# Patient Record
Sex: Male | Born: 1985 | Race: White | Hispanic: No | Marital: Married | Smoking: Never smoker
Health system: Southern US, Community
[De-identification: ages and names within clinical notes are randomized; demographics above are authoritative.]

---

## 2018-01-12 ENCOUNTER — Other Ambulatory Visit: Payer: Self-pay

## 2018-01-12 DIAGNOSIS — Y939 Activity, unspecified: Secondary | ICD-10-CM | POA: Insufficient documentation

## 2018-01-12 DIAGNOSIS — Y92410 Unspecified street and highway as the place of occurrence of the external cause: Secondary | ICD-10-CM | POA: Insufficient documentation

## 2018-01-12 DIAGNOSIS — S39012A Strain of muscle, fascia and tendon of lower back, initial encounter: Secondary | ICD-10-CM | POA: Diagnosis not present

## 2018-01-12 DIAGNOSIS — Y999 Unspecified external cause status: Secondary | ICD-10-CM | POA: Diagnosis not present

## 2018-01-12 DIAGNOSIS — S3992XA Unspecified injury of lower back, initial encounter: Secondary | ICD-10-CM | POA: Diagnosis present

## 2018-01-12 DIAGNOSIS — S060X0A Concussion without loss of consciousness, initial encounter: Secondary | ICD-10-CM | POA: Insufficient documentation
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
## 2018-01-13 ENCOUNTER — Emergency Department: Payer: No Typology Code available for payment source

## 2018-01-13 ENCOUNTER — Emergency Department
Admission: EM | Admit: 2018-01-13 | Discharge: 2018-01-13 | Disposition: A | Discharge status: Home / Self Care | Payer: No Typology Code available for payment source | Attending: Emergency Medicine | Admitting: Emergency Medicine

## 2018-01-13 DIAGNOSIS — S060X0A Concussion without loss of consciousness, initial encounter: Secondary | ICD-10-CM

## 2018-01-13 DIAGNOSIS — S39012A Strain of muscle, fascia and tendon of lower back, initial encounter: Secondary | ICD-10-CM

## 2018-01-13 MED ORDER — LIDOCAINE 5 % EX PTCH: 1.0000 | MEDICATED_PATCH | Freq: Two times a day (BID) | CUTANEOUS | 0 refills | Status: AC

## 2018-01-13 MED ORDER — IBUPROFEN 600 MG PO TABS: 600.0000 mg | ORAL_TABLET | Freq: Three times a day (TID) | ORAL | 0 refills | Status: AC | PRN

## 2018-01-13 MED ORDER — IBUPROFEN 600 MG PO TABS
600.0000 mg | ORAL_TABLET | Freq: Once | ORAL | Status: AC
  Administered 2018-01-13: 600 mg via ORAL
  Filled 2018-01-13: qty 1
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
